# Patient Record
Sex: Male | Born: 1965 | Race: White | Hispanic: No | Marital: Married | Smoking: Never smoker
Health system: Southern US, Community
[De-identification: ages and names within clinical notes are randomized; demographics above are authoritative.]

## PROBLEM LIST (undated history)

## (undated) DIAGNOSIS — I10 Essential (primary) hypertension: Secondary | ICD-10-CM

## (undated) DIAGNOSIS — F419 Anxiety disorder, unspecified: Secondary | ICD-10-CM

## (undated) DIAGNOSIS — E7800 Pure hypercholesterolemia, unspecified: Secondary | ICD-10-CM

## (undated) DIAGNOSIS — K859 Acute pancreatitis without necrosis or infection, unspecified: Secondary | ICD-10-CM

## (undated) HISTORY — PX: VASECTOMY: SHX75

## (undated) HISTORY — PX: WRIST SURGERY: SHX841

---

## 2001-10-28 ENCOUNTER — Emergency Department (HOSPITAL_COMMUNITY): Admission: EM | Admit: 2001-10-28 | Discharge: 2001-10-28 | Payer: Self-pay | Admitting: Emergency Medicine

## 2001-10-28 ENCOUNTER — Encounter: Payer: Self-pay | Admitting: Emergency Medicine

## 2001-10-30 ENCOUNTER — Emergency Department (HOSPITAL_COMMUNITY): Admission: EM | Admit: 2001-10-30 | Discharge: 2001-10-30 | Payer: Self-pay | Admitting: Emergency Medicine

## 2002-01-04 ENCOUNTER — Ambulatory Visit (HOSPITAL_COMMUNITY): Admission: RE | Admit: 2002-01-04 | Discharge: 2002-01-04 | Payer: Self-pay | Admitting: Pulmonary Disease

## 2004-10-22 ENCOUNTER — Emergency Department (HOSPITAL_COMMUNITY): Admission: EM | Admit: 2004-10-22 | Discharge: 2004-10-23 | Payer: Self-pay | Admitting: *Deleted

## 2016-01-01 ENCOUNTER — Inpatient Hospital Stay (HOSPITAL_COMMUNITY)
Admission: EM | Admit: 2016-01-01 | Discharge: 2016-01-04 | DRG: 440 | Disposition: A | Discharge status: Home / Self Care | Payer: BC Managed Care – PPO | Attending: Internal Medicine | Admitting: Internal Medicine

## 2016-01-01 ENCOUNTER — Encounter (HOSPITAL_COMMUNITY): Payer: Self-pay | Admitting: *Deleted

## 2016-01-01 DIAGNOSIS — K219 Gastro-esophageal reflux disease without esophagitis: Secondary | ICD-10-CM | POA: Diagnosis present

## 2016-01-01 DIAGNOSIS — K852 Alcohol induced acute pancreatitis without necrosis or infection: Secondary | ICD-10-CM | POA: Diagnosis not present

## 2016-01-01 DIAGNOSIS — Z6836 Body mass index (BMI) 36.0-36.9, adult: Secondary | ICD-10-CM

## 2016-01-01 DIAGNOSIS — E785 Hyperlipidemia, unspecified: Secondary | ICD-10-CM | POA: Diagnosis present

## 2016-01-01 DIAGNOSIS — K760 Fatty (change of) liver, not elsewhere classified: Secondary | ICD-10-CM | POA: Diagnosis present

## 2016-01-01 DIAGNOSIS — F1010 Alcohol abuse, uncomplicated: Secondary | ICD-10-CM | POA: Diagnosis present

## 2016-01-01 DIAGNOSIS — K859 Acute pancreatitis without necrosis or infection, unspecified: Secondary | ICD-10-CM

## 2016-01-01 DIAGNOSIS — E669 Obesity, unspecified: Secondary | ICD-10-CM | POA: Diagnosis present

## 2016-01-01 DIAGNOSIS — I10 Essential (primary) hypertension: Secondary | ICD-10-CM | POA: Diagnosis present

## 2016-01-01 DIAGNOSIS — F329 Major depressive disorder, single episode, unspecified: Secondary | ICD-10-CM | POA: Diagnosis present

## 2016-01-01 DIAGNOSIS — G4733 Obstructive sleep apnea (adult) (pediatric): Secondary | ICD-10-CM | POA: Diagnosis present

## 2016-01-01 DIAGNOSIS — F419 Anxiety disorder, unspecified: Secondary | ICD-10-CM | POA: Diagnosis present

## 2016-01-01 HISTORY — DX: Anxiety disorder, unspecified: F41.9

## 2016-01-01 HISTORY — DX: Essential (primary) hypertension: I10

## 2016-01-01 HISTORY — DX: Pure hypercholesterolemia, unspecified: E78.00

## 2016-01-01 HISTORY — DX: Acute pancreatitis without necrosis or infection, unspecified: K85.90

## 2016-01-01 LAB — COMPREHENSIVE METABOLIC PANEL
ALT: 43 U/L (ref 17–63)
AST: 53 U/L — ABNORMAL HIGH (ref 15–41)
Albumin: 4.6 g/dL (ref 3.5–5.0)
Alkaline Phosphatase: 55 U/L (ref 38–126)
Anion gap: 11 (ref 5–15)
BUN: 20 mg/dL (ref 6–20)
CO2: 24 mmol/L (ref 22–32)
Calcium: 9.1 mg/dL (ref 8.9–10.3)
Chloride: 104 mmol/L (ref 101–111)
Creatinine, Ser: 1.05 mg/dL (ref 0.61–1.24)
GFR calc Af Amer: >60 mL/min (ref >60)
GFR calc non Af Amer: >60 mL/min (ref >60)
Glucose, Bld: 105 mg/dL — ABNORMAL HIGH (ref 65–99)
Potassium: 4.9 mmol/L (ref 3.5–5.1)
Sodium: 139 mmol/L (ref 135–145)
Total Bilirubin: 1 mg/dL (ref 0.3–1.2)
Total Protein: 6.7 g/dL (ref 6.5–8.1)

## 2016-01-01 LAB — CBC
HEMATOCRIT: 42.1 % (ref 39.0–52.0)
HEMOGLOBIN: 15 g/dL (ref 13.0–17.0)
MCH: 30.6 pg (ref 26.0–34.0)
MCHC: 35.6 g/dL (ref 30.0–36.0)
MCV: 85.9 fL (ref 78.0–100.0)
Platelets: 216 10*3/uL (ref 150–400)
RBC: 4.9 MIL/uL (ref 4.22–5.81)
RDW: 12.9 % (ref 11.5–15.5)
WBC: 9 10*3/uL (ref 4.0–10.5)

## 2016-01-01 LAB — LIPASE, BLOOD: Lipase: 185 U/L — ABNORMAL HIGH (ref 11–51)

## 2016-01-01 MED ORDER — ONDANSETRON HCL 4 MG/2ML IJ SOLN
4.0000 mg | Freq: Once | INTRAMUSCULAR | Status: AC
  Administered 2016-01-01: 4 mg via INTRAVENOUS
  Filled 2016-01-01: qty 2

## 2016-01-01 MED ORDER — MORPHINE SULFATE (PF) 4 MG/ML IV SOLN
6.0000 mg | Freq: Once | INTRAVENOUS | Status: AC
  Administered 2016-01-01: 6 mg via INTRAVENOUS
  Filled 2016-01-01: qty 2

## 2016-01-01 NOTE — ED Notes (Signed)
Pt c/o abd cramping since 1200, an hour after eating a big mac and fries. States he feels like he has a lot of gas but cannot find relief. 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 No N/V/D.

## 2016-01-01 NOTE — ED Provider Notes (Signed)
CSN: [id number]     Arrival date & time 01/01/16  1849 History   First MD Initiated Contact with Patient 01/01/16 2140     Chief Complaint  Patient presents with  . Abdominal Pain     (Consider location/radiation/quality/duration/timing/severity/associated sxs/prior Treatment) HPI   50 year old male with history of hypercholesterolemia and hypertension who presents for evaluation of abdominal pain. Patient report for lunch he went to [employer] and after eating a big Mac and fries he developed gradual onset of upper abdominal pain. He described pain as a sharp and crampy sensation, constant, worsening with movement and with palpation. He felt bloated, and despite taking Pepto-Bismol and Alka-Seltzer he found no relief. He has several normal bowel movements a day without any blood or mucus. He denies feeling nauseous or vomiting. No fever or chills, no chest pain or shortness of breath, no cough. He does admits to drinking alcohol on a daily basis and quantify as 6-9 beers nightly. He denies any prior history of pancreatitis or gallbladder disease. No prior history of abdominal surgery. No history of diabetes. Denies any recent injury.  Past Medical History  Diagnosis Date  . Hypertension   . Hypercholesteremia    History reviewed. No pertinent past surgical history. No family history on file. Social History  Substance Use Topics  . Smoking status: Never Smoker   . Smokeless tobacco: None  . Alcohol Use: Yes     Comment: beer and vodka daily    Review of Systems  All other systems reviewed and are negative.     Allergies  Review of patient's allergies indicates no known allergies.  Home Medications   Prior to Admission medications   Not on File   BP 147/93 mmHg  Pulse 79  Temp(Src) 98.1 F (36.7 C) (Oral)  Ht 5\' 9"  (1.753 m)  Wt 113.399 kg  BMI 36.90 kg/m2  SpO2 95% Physical Exam  Constitutional: He appears well-developed and well-nourished. No distress.  HENT:   Head: Atraumatic.  Eyes: Conjunctivae are normal.  Neck: Neck supple.  Cardiovascular: Normal rate and regular rhythm.   Pulmonary/Chest: Effort normal and breath sounds normal.  Abdominal: Bowel sounds are normal. He exhibits distension. There is tenderness (Tenderness to right upper quadrant on palpation without guarding or rebound tenderness abdomen is distended.).  Neurological: He is alert.  Skin: No rash noted.  Psychiatric: He has a normal mood and affect.  Nursing note and vitals reviewed.   ED Course  Procedures (including critical care time) Labs Review Labs Reviewed  LIPASE, BLOOD - Abnormal; Notable for the following:    Lipase 185 (*)    All other components within normal limits  COMPREHENSIVE METABOLIC PANEL - Abnormal; Notable for the following:    Glucose, Bld 105 (*)    AST 53 (*)    All other components within normal limits  URINALYSIS, ROUTINE W REFLEX MICROSCOPIC (NOT AT [facility]) - Abnormal; Notable for the following:    Specific Gravity, Urine >1.046 (*)    All other components within normal limits  CBC  I-STAT TROPOININ, ED    Imaging Review Ct Abdomen Pelvis W Contrast  01/02/2016  CLINICAL DATA:  Acute onset of generalized abdominal pain. Initial encounter. EXAM: CT ABDOMEN AND PELVIS WITH CONTRAST TECHNIQUE: Multidetector CT imaging of the abdomen and pelvis was performed using the standard protocol following bolus administration of intravenous contrast. CONTRAST:  OMNIPAQUE IOHEXOL 300 MG/ML  SOLN COMPARISON:  None. FINDINGS: Mild bibasilar atelectasis is noted. Soft tissue edema and  free fluid are noted tracking about the second segment of the duodenum, and about the adjacent pancreatic head, with fluid tracking inferiorly anterior to Gerota's fascia on the right. This may reflect inflammation due to duodenitis and underlying duodenal ulceration, or possibly pancreatitis. Would correlate with the patient's symptoms. The liver and spleen are unremarkable  in appearance. The gallbladder is within normal limits. The remainder of the pancreas and adrenal glands are unremarkable. A few small renal cysts are noted bilaterally, measuring up to 1.7 cm in size. There is no evidence of hydronephrosis. A 3 mm stone is noted near the lower pole of the right kidney. No obstructing ureteral stones are seen. Mild nonspecific perinephric stranding is noted bilaterally. No free fluid is identified. The small bowel is unremarkable in appearance. The stomach is within normal limits. No acute vascular abnormalities are seen. The appendix is normal in caliber, without evidence of appendicitis. The appendix tracks about the inferior tip of the liver. The colon is largely decompressed and is unremarkable in appearance. The bladder is mildly distended and grossly unremarkable. The prostate remains normal in size, with minimal calcification. No inguinal lymphadenopathy is seen. No acute osseous abnormalities are identified. Facet disease is noted along the lumbar spine, with a chronic defect across a left-sided articular facet at L3. IMPRESSION: 1. Soft tissue edema and free fluid tracking about the second segment of the duodenum and adjacent pancreatic head, with fluid tracking inferiorly anterior to Gerota's fascia on the right. This may reflect inflammation due to duodenitis and underlying duodenal ulceration, or possibly acute pancreatitis. Would correlate with the patient's symptoms. 2. Few small bilateral renal cysts noted. 3 mm nonobstructing stone near the lower pole of the right kidney. 3. Mild bibasilar atelectasis noted. Electronically Signed   By: Roanna RaiderJeffery  Chang M.D.   On: 01/02/2016 01:07   I have personally reviewed and evaluated these images and lab results as part of my medical decision-making.   EKG Interpretation None      Date: 01/02/2016  Rate: 62  Rhythm: normal sinus rhythm  QRS Axis: normal  Intervals: normal  ST/T Wave abnormalities: normal  Conduction  Disutrbances: none  Narrative Interpretation:   Old EKG Reviewed: No significant changes noted EKG reviewed by me.       MDM   Final diagnoses:  Alcohol-induced acute pancreatitis, unspecified complication status    BP 128/78 mmHg  Pulse 68  Temp(Src) 98.1 F (36.7 C) (Oral)  Resp 15  Ht 5\' 9"  (1.753 m)  Wt 113.399 kg  BMI 36.90 kg/m2  SpO2 95%   Patient with history of alcohol abuse who presents with complaints of upper abdominal pain after [facility]asy [facility]. Pain is right upper quadrant on palpation. He does have elevated lipase 185, normal hepatic function panel and no leukocytosis. Symptoms concerning for pancreatitis versus cholecystitis. Plan to obtain abdominal and pelvis CT scan for further evaluation. Pain medication given, will monitor closely.  Care discussed with Dr. Hyacinth MeekerMiller. Pt's CAGE score is 1.    12:41 AM  Abdominal and pelvis CT scan demonstrated evidence of soft tissues edema and free fluid tracking about the second segment of the duodenum and the adjacent pancreatic head. This may suggest inflammations due to duodenitis versus acute pancreatitis. Since patient admits to drinking alcohol regularly basis and having his present symptoms, and also having an elevated lipase I suspect this is acute pancreatitis. His symptoms has improved after receiving treatment. Plan to consult medicine for admission for further management  of his condition. Pt's Initial Ranson Score is zero.    1:33 AM Appreciate consultation from [facility] Dr. Maryfrances Bunnell who agrees to see pt in the ER and will admit to med surg bed under his care.  Pt agrees with plan.    Fayrene Helper, PA-C 01/02/16 0134  Eber Hong, MD 01/02/16 [phone]

## 2016-01-02 ENCOUNTER — Encounter (HOSPITAL_COMMUNITY): Payer: Self-pay | Admitting: Family Medicine

## 2016-01-02 ENCOUNTER — Observation Stay (HOSPITAL_COMMUNITY): Payer: BC Managed Care – PPO

## 2016-01-02 ENCOUNTER — Emergency Department (HOSPITAL_COMMUNITY): Payer: BC Managed Care – PPO

## 2016-01-02 DIAGNOSIS — K219 Gastro-esophageal reflux disease without esophagitis: Secondary | ICD-10-CM | POA: Diagnosis present

## 2016-01-02 DIAGNOSIS — F419 Anxiety disorder, unspecified: Secondary | ICD-10-CM | POA: Diagnosis present

## 2016-01-02 DIAGNOSIS — I10 Essential (primary) hypertension: Secondary | ICD-10-CM | POA: Diagnosis present

## 2016-01-02 DIAGNOSIS — K859 Acute pancreatitis without necrosis or infection, unspecified: Secondary | ICD-10-CM | POA: Diagnosis not present

## 2016-01-02 DIAGNOSIS — F329 Major depressive disorder, single episode, unspecified: Secondary | ICD-10-CM | POA: Diagnosis present

## 2016-01-02 DIAGNOSIS — K852 Alcohol induced acute pancreatitis without necrosis or infection: Secondary | ICD-10-CM | POA: Diagnosis present

## 2016-01-02 DIAGNOSIS — E785 Hyperlipidemia, unspecified: Secondary | ICD-10-CM | POA: Diagnosis present

## 2016-01-02 DIAGNOSIS — G4733 Obstructive sleep apnea (adult) (pediatric): Secondary | ICD-10-CM | POA: Diagnosis not present

## 2016-01-02 DIAGNOSIS — K760 Fatty (change of) liver, not elsewhere classified: Secondary | ICD-10-CM | POA: Diagnosis present

## 2016-01-02 DIAGNOSIS — E669 Obesity, unspecified: Secondary | ICD-10-CM | POA: Diagnosis present

## 2016-01-02 DIAGNOSIS — F1010 Alcohol abuse, uncomplicated: Secondary | ICD-10-CM | POA: Diagnosis present

## 2016-01-02 DIAGNOSIS — Z6836 Body mass index (BMI) 36.0-36.9, adult: Secondary | ICD-10-CM | POA: Diagnosis not present

## 2016-01-02 HISTORY — DX: Acute pancreatitis without necrosis or infection, unspecified: K85.90

## 2016-01-02 LAB — COMPREHENSIVE METABOLIC PANEL
ALBUMIN: 3.8 g/dL (ref 3.5–5.0)
ALK PHOS: 51 U/L (ref 38–126)
ALT: 40 U/L (ref 17–63)
ANION GAP: 9 (ref 5–15)
AST: 30 U/L (ref 15–41)
BILIRUBIN TOTAL: 0.9 mg/dL (ref 0.3–1.2)
BUN: 16 mg/dL (ref 6–20)
CALCIUM: 8.8 mg/dL — AB (ref 8.9–10.3)
CO2: 26 mmol/L (ref 22–32)
Chloride: 105 mmol/L (ref 101–111)
Creatinine, Ser: 0.9 mg/dL (ref 0.61–1.24)
GFR calc Af Amer: >60 mL/min (ref >60)
Glucose, Bld: 114 mg/dL — ABNORMAL HIGH (ref 65–99)
Potassium: 3.9 mmol/L (ref 3.5–5.1)
Sodium: 140 mmol/L (ref 135–145)
TOTAL PROTEIN: 5.8 g/dL — AB (ref 6.5–8.1)

## 2016-01-02 LAB — CBC
HEMATOCRIT: 39.7 % (ref 39.0–52.0)
HEMOGLOBIN: 13.9 g/dL (ref 13.0–17.0)
MCH: 30.2 pg (ref 26.0–34.0)
MCHC: 35 g/dL (ref 30.0–36.0)
MCV: 86.3 fL (ref 78.0–100.0)
Platelets: 193 10*3/uL (ref 150–400)
RBC: 4.6 MIL/uL (ref 4.22–5.81)
RDW: 13 % (ref 11.5–15.5)
WBC: 7.3 10*3/uL (ref 4.0–10.5)

## 2016-01-02 LAB — URINALYSIS, ROUTINE W REFLEX MICROSCOPIC
BILIRUBIN URINE: NEGATIVE
Glucose, UA: NEGATIVE mg/dL
HGB URINE DIPSTICK: NEGATIVE
KETONES UR: NEGATIVE mg/dL
Leukocytes, UA: NEGATIVE
NITRITE: NEGATIVE
PH: 6.5 (ref 5.0–8.0)
Protein, ur: NEGATIVE mg/dL

## 2016-01-02 LAB — I-STAT TROPONIN, ED: Troponin i, poc: 0 ng/mL (ref 0.00–0.08)

## 2016-01-02 MED ORDER — ENOXAPARIN SODIUM 60 MG/0.6ML ~~LOC~~ SOLN
55.0000 mg | SUBCUTANEOUS | Status: DC
  Administered 2016-01-02 – 2016-01-04 (×3): 55 mg via SUBCUTANEOUS
  Filled 2016-01-02 (×3): qty 0.6

## 2016-01-02 MED ORDER — ATORVASTATIN CALCIUM 80 MG PO TABS
80.0000 mg | ORAL_TABLET | Freq: Every day | ORAL | Status: DC
  Administered 2016-01-02 – 2016-01-03 (×2): 80 mg via ORAL
  Filled 2016-01-02 (×2): qty 1

## 2016-01-02 MED ORDER — IOHEXOL 300 MG/ML  SOLN
100.0000 mL | Freq: Once | INTRAMUSCULAR | Status: AC | PRN
  Administered 2016-01-02: 100 mL via INTRAVENOUS

## 2016-01-02 MED ORDER — SODIUM CHLORIDE 0.9 % IV SOLN
INTRAVENOUS | Status: AC
  Administered 2016-01-02 – 2016-01-03 (×3): via INTRAVENOUS

## 2016-01-02 MED ORDER — SODIUM CHLORIDE 0.9 % IV BOLUS (SEPSIS)
1000.0000 mL | Freq: Once | INTRAVENOUS | Status: AC
Start: 2016-01-02 — End: 2016-01-02
  Administered 2016-01-02: 1000 mL via INTRAVENOUS

## 2016-01-02 MED ORDER — PANTOPRAZOLE SODIUM 40 MG PO TBEC
40.0000 mg | DELAYED_RELEASE_TABLET | Freq: Every day | ORAL | Status: DC
  Administered 2016-01-02: 40 mg via ORAL
  Filled 2016-01-02: qty 1

## 2016-01-02 MED ORDER — POLYETHYLENE GLYCOL 3350 17 G PO PACK: 17.0000 g | PACK | Freq: Every day | ORAL | Status: DC | PRN

## 2016-01-02 MED ORDER — CITALOPRAM HYDROBROMIDE 40 MG PO TABS
40.0000 mg | ORAL_TABLET | Freq: Every day | ORAL | Status: DC
  Administered 2016-01-02 – 2016-01-04 (×3): 40 mg via ORAL
  Filled 2016-01-02 (×3): qty 1

## 2016-01-02 MED ORDER — MORPHINE SULFATE (PF) 4 MG/ML IV SOLN
6.0000 mg | Freq: Once | INTRAVENOUS | Status: AC
  Administered 2016-01-02: 6 mg via INTRAVENOUS
  Filled 2016-01-02: qty 2

## 2016-01-02 MED ORDER — DOCUSATE SODIUM 100 MG PO CAPS
100.0000 mg | ORAL_CAPSULE | Freq: Two times a day (BID) | ORAL | Status: DC
  Administered 2016-01-02 – 2016-01-04 (×5): 100 mg via ORAL
  Filled 2016-01-02 (×5): qty 1

## 2016-01-02 MED ORDER — ONDANSETRON HCL 4 MG PO TABS
4.0000 mg | ORAL_TABLET | Freq: Four times a day (QID) | ORAL | Status: DC | PRN
Start: 2016-01-02 — End: 2016-01-04

## 2016-01-02 MED ORDER — ONDANSETRON HCL 4 MG/2ML IJ SOLN: 4.0000 mg | Freq: Four times a day (QID) | INTRAMUSCULAR | Status: DC | PRN

## 2016-01-02 MED ORDER — AMLODIPINE BESYLATE 2.5 MG PO TABS
2.5000 mg | ORAL_TABLET | Freq: Every day | ORAL | Status: DC
  Administered 2016-01-02 – 2016-01-04 (×3): 2.5 mg via ORAL
  Filled 2016-01-02 (×3): qty 1

## 2016-01-02 MED ORDER — PANTOPRAZOLE SODIUM 40 MG IV SOLR
40.0000 mg | Freq: Two times a day (BID) | INTRAVENOUS | Status: DC
  Administered 2016-01-02 – 2016-01-03 (×3): 40 mg via INTRAVENOUS
  Filled 2016-01-02 (×3): qty 40

## 2016-01-02 MED ORDER — ASPIRIN EC 81 MG PO TBEC
81.0000 mg | DELAYED_RELEASE_TABLET | Freq: Every day | ORAL | Status: DC
  Administered 2016-01-02 – 2016-01-04 (×3): 81 mg via ORAL
  Filled 2016-01-02 (×3): qty 1

## 2016-01-02 MED ORDER — HYDROMORPHONE HCL 1 MG/ML IJ SOLN
0.5000 mg | INTRAMUSCULAR | Status: DC | PRN
  Administered 2016-01-02 (×4): 1 mg via INTRAVENOUS
  Filled 2016-01-02 (×4): qty 1

## 2016-01-02 MED ORDER — LABETALOL HCL 5 MG/ML IV SOLN
5.0000 mg | Freq: Four times a day (QID) | INTRAVENOUS | Status: DC | PRN
  Filled 2016-01-02: qty 4

## 2016-01-02 NOTE — H&P (Signed)
History and Physical  Patient Name: Raymond Henry     ZOX:[id number]    DOB: 08/28/1966    DOA: 01/01/2016 Referring physician: Fayrene Helper, PA-C PCP: No PCP Per Patient previously Geni Bers     Chief Complaint: Abdominal pain  HPI: Raymond Henry is a 50 y.o. male with a past medical history significant for HTN, OSA, and GERD who presents with acute epigastric pain.  The patient was in his usual state of health until today at lunch about 30 minutes after eating he developed moderate to severe epigastric pain and bloating after eating a hamburger at a fast food restaurant.  He and his wife went to the store after, and the pain was not exertional, but it was so bad he had to go home and lie down.  It was not associated with emesis, diarrhea, hematemesis, or exertional symptoms. However the pain did not improve with antacids and rest and so he came to the ER.  In the ED, the patient was afebrile and hemodynamically stable. He had normal renal function, no leukocytosis, and a normal troponin and ECG. The AST was slightly above the upper limit of normal, the lipase was 185 U/L, and a CT of the abdomen and pelvis with contrast showed early pancreatitis. Pain medicine and IV fluids were administered, and TSH were asked to admit for pancreatitis.  The patient is taking no new medicines or supplements. He has never had pancreatitis before or gallstones. He drinks alcohol most days, beer and mixed drinks, and quantifies this as anywhere from 3 to "a few", although he estimates that he has been drinking less over the last 2 weeks than usual.     Review of Systems:  Pt complains of epigastric pain, right upper quadrant pain. Pt denies any dyspnea, chest pain, diaphoresis.   denies emesis, hematemesis, diarrhea, melena. All other systems negative except as just noted or noted in the history of present illness.   Allergies: No Known Allergies  Home medications: 1. Amlodipine 2.5 mg  daily 2. Losartan-hydrochlorothiazide 100-25 milligrams daily 3. Omeprazole 20 mg daily 4. Aspirin 81 mg daily 5. Atorvastatin 80 mg daily 6. Citalopram 40 mg daily Albuterol as needed  Past medical history: 1. Essential hypertension 2. Obesity 3. Allergic rhinitis and chronic bronchitis 4. OSA on CPAP 5. Erectile dysfunction, previously on testosterone supplements  Past surgical history: 1. Wrist surgery 2. Vasectomy  Family history:  Father, diabetes, amputations. Mother, strokes in her 50s, deceased at age 70.    Social History:  Patient lives with his wife in [place]. He works at [employer].  He is a nonsmoker. He reports daily alcohol use.       Physical Exam: BP 144/82 mmHg  Pulse 64  Temp(Src) 98.1 F (36.7 C) (Oral)  Resp 19  Ht 5\' 9"  (1.753 m)  Wt 113.399 kg (250 lb)  BMI 36.90 kg/m2  SpO2 95% General appearance: Well-developed, obese adult male, alert and in no acute distress.   Eyes: Anicteric, conjunctiva pink, lids and lashes normal.     ENT: No nasal deformity, discharge, or epistaxis.  OP moist without lesions.   Lymph: No cervical, supraclavicular lymphadenopathy. Skin: Warm and dry.  No jaundice.  No suspicious rashes or lesions. Cardiac: RRR, nl S1-S2, no murmurs appreciated.  Capillary refill is brisk.  No LE edema.  Radial and DP pulses 2+ and symmetric. Respiratory: Normal respiratory rate and rhythm.  CTAB without rales or wheezes. Abdomen: Abdomen soft without rigidity.  Mild RUQ and  epigastric TTP. No ascites, distension.   MSK: No deformities or effusions. Neuro: Sensorium intact and responding to questions, attention normal.  Speech is fluent.  Moves all extremities equally and with normal coordination.    Psych: Behavior appropriate.  Affect normal.  No evidence of aural or visual hallucinations or delusions.       Labs on Admission:  The metabolic panel shows normal sodium, potassium, bicarbonate, and renal function. The AST is slightly  above the upper limit of normal, the ALT is normal (as it was in September per care everywhere ), the alkaline phosphatase and bilirubin are normal. The troponin is negative. The urinalysis shows elevated specific gravity and is otherwise bland. The lipase is 185 U/L. The complete blood count shows no anemia, leukocytosis, or thrombocytopenia.   Radiological Exams on Admission: Ct Abdomen Pelvis W Contrast 01/02/2016  IMPRESSION: 1. Soft tissue edema and free fluid tracking about the second segment of the duodenum and adjacent pancreatic head, with fluid tracking inferiorly anterior to Gerota's fascia on the right. This may reflect inflammation due to duodenitis and underlying duodenal ulceration, or possibly acute pancreatitis. Would correlate with the patient's symptoms. 2. Few small bilateral renal cysts noted. 3 mm nonobstructing stone near the lower pole of the right kidney. 3. Mild bibasilar atelectasis noted. Electronically Signed   By: Roanna RaiderJeffery  Chang M.D.   On: 01/02/2016 01:07    EKG: Independently reviewed. Sinus rhythm, rate 62, QTC 413, no ST TW changes.     Assessment/Plan 1. Acute pancreatitis:  This is new.  Suspected etiology is alcohol.  Gallstone pancreatitis is within the differential given elevated AST.    -NPO -IVF and hydromorphone 0.5-1 mg q2hrs PRN for pain -US abdomen complete  2. Elevated AST:  This is new.  Previous records show an ALT that was normal on routine outpatient lab work. This is either related to nonalcoholic fatty liver disease or possibly a gallstone. -US abdomen complete -Trend LFTs -If dilated CBD or LFTs trending up, will consult GI -If not, this AST can be followed up as outpatient  3. Essential hypertension:  Slightly hypertensive at admission. -Continue amlodipine -Hold losartan and diuretic until pancreatitis clearly resolving -Labetalol PRN for very high BP  4. History of anxiety/depression:  -Continue home citalopram  5.  OSA: -Nightly CPAP    DVT PPx: Lovenox Diet: NPO Consultants: None Code Status: Full Family Communication: Wife, present at bedside  Medical decision making: What exists of the patient's previous chart and Care Everywhere was reviewed in depth and the case was discussed with Fayrene HelperBowie Tran, PA-C. Patient seen 2:02 AM on 01/02/2016.  Disposition Plan:  Observe and treat with IVF and IV analgesics.  Patient already hungry, and may be able to advance diet quickly and be motivated to discharge.  At the point of initial evaluation, it is my clinical opinion that admission for OBSERVATION is reasonable and necessary because the patient's presenting complaints in the context of their chronic conditions represent sufficient risk of deterioration or significant morbidity to constitute reasonable grounds for close observation in the hospital setting, but that the patient may be medically stable for discharge from the hospital within 24 to 48 hours.       Alberteen SamChristopher P Meg Niemeier [facility] Pager [phone]

## 2016-01-02 NOTE — Progress Notes (Signed)
PATIENT DETAILS Name: Raymond Henry: 50 y.o. Sex: male Date of Birth: 09/13/1966 Admit Date: 01/01/2016 Admitting Physician Alberteen Sam, MD PCP:No PCP Per Patient  Subjective: Abdominal pain slowly improving-related it around 3-4/10 this morning.  Assessment/Plan: Principal Problem: Acute pancreatitis: Suspect secondary to alcohol-patient does acknowledge drinking 2-516 ounce bottles of beer almost on a daily basis. No gallstones evident on CT scan or abdominal ultrasound. Abdomen is soft with mild tenderness in the epigastric area. Continue nothing by mouth status for today, will reassess tomorrow and start clear liquids. Have encouraged patient completely abstain from alcohol use in the future.  Active Problems: Alcohol abuse: No signs of withdrawal, per patient he can go days without having any tremors. Follow and start Ativan per protocol if needed.  GERD: Continue PPI-change to twice a day for now  Dyslipidemia: Continue statin  Hypertension: Controlled, continue amlodipine  OSA (obstructive sleep apnea): Continue CPAP  History of anxiety/depression: Continue Celexa  Disposition: Remain inpatient  Antimicrobial agents  See below  Anti-infectives    None      DVT Prophylaxis: Prophylactic Lovenox   Code Status: Full code   Family Communication None at bedside  Procedures: None  CONSULTS:  None  Time spent 40 minutes-Greater than 50% of this time was spent in counseling, explanation of diagnosis, planning of further management, and coordination of care.  MEDICATIONS: Scheduled Meds: . amLODipine  2.5 mg Oral Daily  . aspirin EC  81 mg Oral Daily  . atorvastatin  80 mg Oral q1800  . citalopram  40 mg Oral Daily  . docusate sodium  100 mg Oral BID  . enoxaparin (LOVENOX) injection  55 mg Subcutaneous Q24H  . pantoprazole  40 mg Oral Daily   Continuous Infusions: . sodium chloride 150 mL/hr at 01/02/16 1137   PRN  Meds:.HYDROmorphone (DILAUDID) injection, labetalol, ondansetron **OR** ondansetron (ZOFRAN) IV, polyethylene glycol    PHYSICAL EXAM: Vital signs in last 24 hours: Filed Vitals:   01/02/16 0200 01/02/16 0248 01/02/16 1133 01/02/16 1256  BP: 136/82 121/76 134/76 129/83  Pulse: 65 68  73  Temp:  98.1 F (36.7 C) 98 F (36.7 C) 98.1 F (36.7 C)  TempSrc:  Oral  Oral  Resp: 18 18  18   Height:      Weight:      SpO2: 95% 94%  94%    Weight change:  Filed Weights   01/01/16 1858  Weight: 113.399 kg (250 lb)   Body mass index is 36.9 kg/(m^2).   Gen Exam: Awake and alert with clear speech.   Neck: Supple, No JVD.   Chest: B/L Clear.   CVS: S1 S2 Regular, no murmurs.  Abdomen: soft, BS +, mildly tender epigastric area, non distended.  Extremities: no edema, lower extremities warm to touch. Neurologic: Non Focal.   Skin: No Rash.   Wounds: N/A.    Intake/Output from previous day:  Intake/Output Summary (Last 24 hours) at 01/02/16 1514 Last data filed at 01/02/16 1258  Gross per 24 hour  Intake      0 ml  Output      0 ml  Net      0 ml     LAB RESULTS: CBC  Recent Labs Lab 01/01/16 1907 01/02/16 0321  WBC 9.0 7.3  HGB 15.0 13.9  HCT 42.1 39.7  PLT 216 193  MCV 85.9 86.3  MCH 30.6 30.2  MCHC  35.6 35.0  RDW 12.9 13.0    Chemistries   Recent Labs Lab 01/01/16 1907 01/02/16 0321  NA 139 140  K 4.9 3.9  CL 104 105  CO2 24 26  GLUCOSE 105* 114*  BUN 20 16  CREATININE 1.05 0.90  CALCIUM 9.1 8.8*    CBG: No results for input(s): GLUCAP in the last 168 hours.  GFR Estimated Creatinine Clearance: 120.6 mL/min (by C-G formula based on Cr of 0.9).  Coagulation profile No results for input(s): INR, PROTIME in the last 168 hours.  Cardiac Enzymes No results for input(s): CKMB, TROPONINI, MYOGLOBIN in the last 168 hours.  Invalid input(s): CK  Invalid input(s): POCBNP No results for input(s): DDIMER in the last 72 hours. No results for  input(s): HGBA1C in the last 72 hours. No results for input(s): CHOL, HDL, LDLCALC, TRIG, CHOLHDL, LDLDIRECT in the last 72 hours. No results for input(s): TSH, T4TOTAL, T3FREE, THYROIDAB in the last 72 hours.  Invalid input(s): FREET3 No results for input(s): VITAMINB12, FOLATE, FERRITIN, TIBC, IRON, RETICCTPCT in the last 72 hours.  Recent Labs  01/01/16 1907  LIPASE 185*    Urine Studies No results for input(s): UHGB, CRYS in the last 72 hours.  Invalid input(s): UACOL, UAPR, USPG, UPH, UTP, UGL, UKET, UBIL, UNIT, UROB, ULEU, UEPI, UWBC, URBC, UBAC, CAST, UCOM, BILUA  MICROBIOLOGY: No results found for this or any previous visit (from the past 240 hour(s)).  RADIOLOGY STUDIES/RESULTS: US Abdomen Complete  01/02/2016  CLINICAL DATA:  Pancreatitis EXAM: ABDOMEN ULTRASOUND COMPLETE COMPARISON:  Abdominal CT from the same day FINDINGS: Gallbladder: No gallstones or wall thickening visualized. No sonographic Murphy sign noted by sonographer. Common bile duct: Diameter: 6 mm. The duct is partially visualized due to narrow sonographic windows. Where visualized, no filling defect. Liver: Echogenic and heterogeneous with sparing noted around the gallbladder fossa. No evidence of focal lesion. Antegrade flow in the imaged portal venous system. IVC: Negative by grayscale. Pancreas: Expanded appearance of the head correlating with history of pancreatitis. Spleen: Size and appearance within normal limits. Right Kidney: Length: 14 cm. 13 mm cyst noted. Echogenicity within normal limits. No solid mass or hydronephrosis visualized. Left Kidney: Length: 13 cm. The cortex appears thin, but no thinning noted on previous abdominal CT. Echogenicity within normal limits. No mass or hydronephrosis visualized. Abdominal aorta: No aneurysm visualized. Other findings: None. IMPRESSION: 1. No evidence of biliary calculus. 2. Hepatic steatosis. Electronically Signed   By: Marnee Spring M.D.   On: 01/02/2016 07:59    Ct Abdomen Pelvis W Contrast  01/02/2016  CLINICAL DATA:  Acute onset of generalized abdominal pain. Initial encounter. EXAM: CT ABDOMEN AND PELVIS WITH CONTRAST TECHNIQUE: Multidetector CT imaging of the abdomen and pelvis was performed using the standard protocol following bolus administration of intravenous contrast. CONTRAST:  OMNIPAQUE IOHEXOL 300 MG/ML  SOLN COMPARISON:  None. FINDINGS: Mild bibasilar atelectasis is noted. Soft tissue edema and free fluid are noted tracking about the second segment of the duodenum, and about the adjacent pancreatic head, with fluid tracking inferiorly anterior to Gerota's fascia on the right. This may reflect inflammation due to duodenitis and underlying duodenal ulceration, or possibly pancreatitis. Would correlate with the patient's symptoms. The liver and spleen are unremarkable in appearance. The gallbladder is within normal limits. The remainder of the pancreas and adrenal glands are unremarkable. A few small renal cysts are noted bilaterally, measuring up to 1.7 cm in size. There is no evidence of hydronephrosis. A 3 mm  stone is noted near the lower pole of the right kidney. No obstructing ureteral stones are seen. Mild nonspecific perinephric stranding is noted bilaterally. No free fluid is identified. The small bowel is unremarkable in appearance. The stomach is within normal limits. No acute vascular abnormalities are seen. The appendix is normal in caliber, without evidence of appendicitis. The appendix tracks about the inferior tip of the liver. The colon is largely decompressed and is unremarkable in appearance. The bladder is mildly distended and grossly unremarkable. The prostate remains normal in size, with minimal calcification. No inguinal lymphadenopathy is seen. No acute osseous abnormalities are identified. Facet disease is noted along the lumbar spine, with a chronic defect across a left-sided articular facet at L3. IMPRESSION: 1. Soft tissue  edema and free fluid tracking about the second segment of the duodenum and adjacent pancreatic head, with fluid tracking inferiorly anterior to Gerota's fascia on the right. This may reflect inflammation due to duodenitis and underlying duodenal ulceration, or possibly acute pancreatitis. Would correlate with the patient's symptoms. 2. Few small bilateral renal cysts noted. 3 mm nonobstructing stone near the lower pole of the right kidney. 3. Mild bibasilar atelectasis noted. Electronically Signed   By: Roanna RaiderJeffery  Chang M.D.   On: 01/02/2016 01:07    Jeoffrey MassedGHIMIRE,SHANKER, MD  [facility] Pager:[phone]  If 7PM-7AM, please contact night-coverage [email or web address] Password [email or web address] 01/02/2016, 3:14 PM   LOS: 0 days

## 2016-01-02 NOTE — Progress Notes (Signed)
Patient did not feel that facility CPAP provided the pressure that he needed and brought his home unit.  Power cord inspected with no obvious tears/fraying noted.  Home unit started and applied by patient in front of RT without issue.

## 2016-01-03 DIAGNOSIS — G4733 Obstructive sleep apnea (adult) (pediatric): Secondary | ICD-10-CM

## 2016-01-03 DIAGNOSIS — I10 Essential (primary) hypertension: Secondary | ICD-10-CM

## 2016-01-03 LAB — COMPREHENSIVE METABOLIC PANEL
ALBUMIN: 3.5 g/dL (ref 3.5–5.0)
ALK PHOS: 46 U/L (ref 38–126)
ALT: 30 U/L (ref 17–63)
ANION GAP: 8 (ref 5–15)
AST: 22 U/L (ref 15–41)
BUN: 10 mg/dL (ref 6–20)
CALCIUM: 8.7 mg/dL — AB (ref 8.9–10.3)
CHLORIDE: 105 mmol/L (ref 101–111)
CO2: 27 mmol/L (ref 22–32)
Creatinine, Ser: 0.86 mg/dL (ref 0.61–1.24)
GFR calc non Af Amer: >60 mL/min (ref >60)
GLUCOSE: 106 mg/dL — AB (ref 65–99)
POTASSIUM: 3.8 mmol/L (ref 3.5–5.1)
SODIUM: 140 mmol/L (ref 135–145)
Total Bilirubin: 1.4 mg/dL — ABNORMAL HIGH (ref 0.3–1.2)
Total Protein: 5.7 g/dL — ABNORMAL LOW (ref 6.5–8.1)

## 2016-01-03 LAB — CBC
HEMATOCRIT: 38.3 % — AB (ref 39.0–52.0)
Hemoglobin: 13.1 g/dL (ref 13.0–17.0)
MCH: 29.6 pg (ref 26.0–34.0)
MCHC: 34.2 g/dL (ref 30.0–36.0)
MCV: 86.7 fL (ref 78.0–100.0)
Platelets: 167 10*3/uL (ref 150–400)
RBC: 4.42 MIL/uL (ref 4.22–5.81)
RDW: 12.7 % (ref 11.5–15.5)
WBC: 5.4 10*3/uL (ref 4.0–10.5)

## 2016-01-03 LAB — LIPASE, BLOOD: LIPASE: 45 U/L (ref 11–51)

## 2016-01-03 MED ORDER — PANTOPRAZOLE SODIUM 40 MG PO TBEC
40.0000 mg | DELAYED_RELEASE_TABLET | Freq: Two times a day (BID) | ORAL | Status: DC
  Administered 2016-01-03 – 2016-01-04 (×2): 40 mg via ORAL
  Filled 2016-01-03 (×2): qty 1

## 2016-01-03 MED ORDER — HYDROMORPHONE HCL 1 MG/ML IJ SOLN: 0.5000 mg | INTRAMUSCULAR | Status: DC | PRN

## 2016-01-03 MED ORDER — OXYCODONE HCL 5 MG PO TABS: 5.0000 mg | ORAL_TABLET | Freq: Four times a day (QID) | ORAL | Status: DC | PRN

## 2016-01-03 NOTE — Progress Notes (Signed)
PATIENT DETAILS Name: Raymond MatasDwight Henry Age: 50 y.o. Sex: male Date of Birth: Feb 09, 1966 Admit Date: 01/01/2016 Admitting Physician Alberteen Samhristopher P Danford, MD PCP:No PCP Per Patient  Subjective: No Abdominal pain this morning.   Assessment/Plan: Principal Problem: Acute pancreatitis: Suspect secondary to alcohol-patient does acknowledge drinking 2-516 ounce bottles of beer almost on a daily basis. No gallstones evident on CT scan or abdominal ultrasound. Abdomen is soft with mild tenderness in the epigastric area.  Initially kept nothing by mouth, since improved, will start clear liquids and advance diet. Have encouraged patient completely abstain from alcohol use in the future.  Active Problems: Alcohol abuse: No signs of withdrawal, per patient he can go days without having any tremors. Follow and start Ativan per protocol if needed.  GERD: Continue PPI-change to twice a day for now  Dyslipidemia: Continue statin  Hypertension: Controlled, continue amlodipine  OSA (obstructive sleep apnea): Continue CPAP  History of anxiety/depression: Continue Celexa  Disposition: Remain inpatient-home tomorrow if clinical improvement continues   Antimicrobial agents  See below  Anti-infectives    None      DVT Prophylaxis: Prophylactic Lovenox   Code Status: Full code   Family Communication Spouse at bedside  Procedures: None  CONSULTS:  None  Time spent 420 minutes-Greater than 50% of this time was spent in counseling, explanation of diagnosis, planning of further management, and coordination of care.  MEDICATIONS: Scheduled Meds: . amLODipine  2.5 mg Oral Daily  . aspirin EC  81 mg Oral Daily  . atorvastatin  80 mg Oral q1800  . citalopram  40 mg Oral Daily  . docusate sodium  100 mg Oral BID  . enoxaparin (LOVENOX) injection  55 mg Subcutaneous Q24H  . pantoprazole (PROTONIX) IV  40 mg Intravenous Q12H   Continuous Infusions: . sodium chloride  50 mL/hr at 01/03/16 0959   PRN Meds:.HYDROmorphone (DILAUDID) injection, labetalol, ondansetron **OR** ondansetron (ZOFRAN) IV, oxyCODONE, polyethylene glycol    PHYSICAL EXAM: Vital signs in last 24 hours: Filed Vitals:   01/02/16 1133 01/02/16 1256 01/02/16 2224 01/03/16 0700  BP: 134/76 129/83 148/73 138/67  Pulse:  73 79 66  Temp: 98 F (36.7 C) 98.1 F (36.7 C) 98.9 F (37.2 C) 98.3 F (36.8 C)  TempSrc:  Oral Oral Oral  Resp:  18 18 18   Height:      Weight:      SpO2:  94% 93% 96%    Weight change:  Filed Weights   01/01/16 1858  Weight: 113.399 kg (250 lb)   Body mass index is 36.9 kg/(m^2).   Gen Exam: Awake and alert with clear speech.   Neck: Supple, No JVD.   Chest: B/L Clear.   CVS: S1 S2 Regular, no murmurs.  Abdomen: soft, BS +, nontender , non distended.  Extremities: no edema, lower extremities warm to touch. Neurologic: Non Focal.   Skin: No Rash.   Wounds: N/A.    Intake/Output from previous day:  Intake/Output Summary (Last 24 hours) at 01/03/16 1228 Last data filed at 01/03/16 0600  Gross per 24 hour  Intake   1650 ml  Output      0 ml  Net   1650 ml     LAB RESULTS: CBC  Recent Labs Lab 01/01/16 1907 01/02/16 0321 01/03/16 0533  WBC 9.0 7.3 5.4  HGB 15.0 13.9 13.1  HCT 42.1 39.7 38.3*  PLT 216 193 167  MCV  85.9 86.3 86.7  MCH 30.6 30.2 29.6  MCHC 35.6 35.0 34.2  RDW 12.9 13.0 12.7    Chemistries   Recent Labs Lab 01/01/16 1907 01/02/16 0321 01/03/16 0533  NA 139 140 140  K 4.9 3.9 3.8  CL 104 105 105  CO2 24 26 27   GLUCOSE 105* 114* 106*  BUN 20 16 10   CREATININE 1.05 0.90 0.86  CALCIUM 9.1 8.8* 8.7*    CBG: No results for input(s): GLUCAP in the last 168 hours.  GFR Estimated Creatinine Clearance: 126.2 mL/min (by C-G formula based on Cr of 0.86).  Coagulation profile No results for input(s): INR, PROTIME in the last 168 hours.  Cardiac Enzymes No results for input(s): CKMB, TROPONINI, MYOGLOBIN in  the last 168 hours.  Invalid input(s): CK  Invalid input(s): POCBNP No results for input(s): DDIMER in the last 72 hours. No results for input(s): HGBA1C in the last 72 hours. No results for input(s): CHOL, HDL, LDLCALC, TRIG, CHOLHDL, LDLDIRECT in the last 72 hours. No results for input(s): TSH, T4TOTAL, T3FREE, THYROIDAB in the last 72 hours.  Invalid input(s): FREET3 No results for input(s): VITAMINB12, FOLATE, FERRITIN, TIBC, IRON, RETICCTPCT in the last 72 hours.  Recent Labs  01/01/16 1907 01/03/16 0533  LIPASE 185* 45    Urine Studies No results for input(s): UHGB, CRYS in the last 72 hours.  Invalid input(s): UACOL, UAPR, USPG, UPH, UTP, UGL, UKET, UBIL, UNIT, UROB, ULEU, UEPI, UWBC, URBC, UBAC, CAST, UCOM, BILUA  MICROBIOLOGY: No results found for this or any previous visit (from the past 240 hour(s)).  RADIOLOGY STUDIES/RESULTS: US Abdomen Complete  01/02/2016  CLINICAL DATA:  Pancreatitis EXAM: ABDOMEN ULTRASOUND COMPLETE COMPARISON:  Abdominal CT from the same day FINDINGS: Gallbladder: No gallstones or wall thickening visualized. No sonographic Murphy sign noted by sonographer. Common bile duct: Diameter: 6 mm. The duct is partially visualized due to narrow sonographic windows. Where visualized, no filling defect. Liver: Echogenic and heterogeneous with sparing noted around the gallbladder fossa. No evidence of focal lesion. Antegrade flow in the imaged portal venous system. IVC: Negative by grayscale. Pancreas: Expanded appearance of the head correlating with history of pancreatitis. Spleen: Size and appearance within normal limits. Right Kidney: Length: 14 cm. 13 mm cyst noted. Echogenicity within normal limits. No solid mass or hydronephrosis visualized. Left Kidney: Length: 13 cm. The cortex appears thin, but no thinning noted on previous abdominal CT. Echogenicity within normal limits. No mass or hydronephrosis visualized. Abdominal aorta: No aneurysm visualized. Other  findings: None. IMPRESSION: 1. No evidence of biliary calculus. 2. Hepatic steatosis. Electronically Signed   By: Marnee Spring M.D.   On: 01/02/2016 07:59   Ct Abdomen Pelvis W Contrast  01/02/2016  CLINICAL DATA:  Acute onset of generalized abdominal pain. Initial encounter. EXAM: CT ABDOMEN AND PELVIS WITH CONTRAST TECHNIQUE: Multidetector CT imaging of the abdomen and pelvis was performed using the standard protocol following bolus administration of intravenous contrast. CONTRAST:  OMNIPAQUE IOHEXOL 300 MG/ML  SOLN COMPARISON:  None. FINDINGS: Mild bibasilar atelectasis is noted. Soft tissue edema and free fluid are noted tracking about the second segment of the duodenum, and about the adjacent pancreatic head, with fluid tracking inferiorly anterior to Gerota's fascia on the right. This may reflect inflammation due to duodenitis and underlying duodenal ulceration, or possibly pancreatitis. Would correlate with the patient's symptoms. The liver and spleen are unremarkable in appearance. The gallbladder is within normal limits. The remainder of the pancreas and adrenal glands are  unremarkable. A few small renal cysts are noted bilaterally, measuring up to 1.7 cm in size. There is no evidence of hydronephrosis. A 3 mm stone is noted near the lower pole of the right kidney. No obstructing ureteral stones are seen. Mild nonspecific perinephric stranding is noted bilaterally. No free fluid is identified. The small bowel is unremarkable in appearance. The stomach is within normal limits. No acute vascular abnormalities are seen. The appendix is normal in caliber, without evidence of appendicitis. The appendix tracks about the inferior tip of the liver. The colon is largely decompressed and is unremarkable in appearance. The bladder is mildly distended and grossly unremarkable. The prostate remains normal in size, with minimal calcification. No inguinal lymphadenopathy is seen. No acute osseous abnormalities  are identified. Facet disease is noted along the lumbar spine, with a chronic defect across a left-sided articular facet at L3. IMPRESSION: 1. Soft tissue edema and free fluid tracking about the second segment of the duodenum and adjacent pancreatic head, with fluid tracking inferiorly anterior to Gerota's fascia on the right. This may reflect inflammation due to duodenitis and underlying duodenal ulceration, or possibly acute pancreatitis. Would correlate with the patient's symptoms. 2. Few small bilateral renal cysts noted. 3 mm nonobstructing stone near the lower pole of the right kidney. 3. Mild bibasilar atelectasis noted. Electronically Signed   By: Roanna Raider M.D.   On: 01/02/2016 01:07    Jeoffrey Massed, MD  [facility] Pager:[phone]  If 7PM-7AM, please contact night-coverage [email or web address] Password [email or web address] 01/03/2016, 12:28 PM   LOS: 1 day

## 2016-01-04 MED ORDER — OMEPRAZOLE 40 MG PO CPDR: 40.0000 mg | DELAYED_RELEASE_CAPSULE | Freq: Every day | ORAL | Status: AC

## 2016-01-04 MED ORDER — LOSARTAN POTASSIUM 100 MG PO TABS: 100.0000 mg | ORAL_TABLET | Freq: Every day | ORAL | Status: AC

## 2016-01-04 MED ORDER — AMLODIPINE BESYLATE 5 MG PO TABS: 5.0000 mg | ORAL_TABLET | Freq: Every day | ORAL | Status: AC

## 2016-01-04 NOTE — Discharge Summary (Signed)
PATIENT DETAILS Name: Raymond Henry Age: 50 y.o. Sex: male Date of Birth: Mar 29, 1966 MRN: [id number]. Admitting Physician: Alberteen Sam, MD NFA:[id number] Blane Ohara, MD  Admit Date: 01/01/2016 Discharge date: 01/04/2016  Recommendations for Outpatient Follow-up:  1. Ensure follow-up with [facility] gastroenterology 2. Counseled regarding avoidance of alcohol  3. Please repeat CBC/BMET at next visit  PRIMARY DISCHARGE DIAGNOSIS:  Principal Problem:   Acute pancreatitis Active Problems:   Essential hypertension   OSA (obstructive sleep apnea)      PAST MEDICAL HISTORY: Past Medical History  Diagnosis Date  . Hypertension   . Hypercholesteremia   . Pancreatitis, acute 01/02/2016  . Anxiety     DISCHARGE MEDICATIONS: Current Discharge Medication List    START taking these medications   Details  losartan (COZAAR) 100 MG tablet Take 1 tablet (100 mg total) by mouth daily. Qty: 30 tablet, Refills: 0      CONTINUE these medications which have CHANGED   Details  amLODipine (NORVASC) 5 MG tablet Take 1 tablet (5 mg total) by mouth daily. Qty: 30 tablet, Refills: 0    omeprazole (PRILOSEC) 40 MG capsule Take 1 capsule (40 mg total) by mouth daily. Qty: 30 capsule, Refills: 0      CONTINUE these medications which have NOT CHANGED   Details  aspirin EC 81 MG tablet Take 81 mg by mouth daily.    atorvastatin (LIPITOR) 80 MG tablet Take 80 mg by mouth daily.    citalopram (CELEXA) 40 MG tablet Take 40 mg by mouth daily.    fluticasone (FLONASE) 50 MCG/ACT nasal spray Place 1 spray into both nostrils daily as needed for allergies or rhinitis.      STOP taking these medications     losartan-hydrochlorothiazide (HYZAAR) 100-25 MG tablet         ALLERGIES:  No Known Allergies  BRIEF HPI:  See H&P, Labs, Consult and Test reports for all details in brief, patient was admitted for valuation of abdominal pain. Found to have pancreatitis and admitted to the hospital  for further evaluation and treatment  CONSULTATIONS:   None  PERTINENT RADIOLOGIC STUDIES: US Abdomen Complete  01/02/2016  CLINICAL DATA:  Pancreatitis EXAM: ABDOMEN ULTRASOUND COMPLETE COMPARISON:  Abdominal CT from the same day FINDINGS: Gallbladder: No gallstones or wall thickening visualized. No sonographic Murphy sign noted by sonographer. Common bile duct: Diameter: 6 mm. The duct is partially visualized due to narrow sonographic windows. Where visualized, no filling defect. Liver: Echogenic and heterogeneous with sparing noted around the gallbladder fossa. No evidence of focal lesion. Antegrade flow in the imaged portal venous system. IVC: Negative by grayscale. Pancreas: Expanded appearance of the head correlating with history of pancreatitis. Spleen: Size and appearance within normal limits. Right Kidney: Length: 14 cm. 13 mm cyst noted. Echogenicity within normal limits. No solid mass or hydronephrosis visualized. Left Kidney: Length: 13 cm. The cortex appears thin, but no thinning noted on previous abdominal CT. Echogenicity within normal limits. No mass or hydronephrosis visualized. Abdominal aorta: No aneurysm visualized. Other findings: None. IMPRESSION: 1. No evidence of biliary calculus. 2. Hepatic steatosis. Electronically Signed   By: Marnee Spring M.D.   On: 01/02/2016 07:59   Ct Abdomen Pelvis W Contrast  01/02/2016  CLINICAL DATA:  Acute onset of generalized abdominal pain. Initial encounter. EXAM: CT ABDOMEN AND PELVIS WITH CONTRAST TECHNIQUE: Multidetector CT imaging of the abdomen and pelvis was performed using the standard protocol following bolus administration of intravenous contrast. CONTRAST:  OMNIPAQUE IOHEXOL  300 MG/ML  SOLN COMPARISON:  None. FINDINGS: Mild bibasilar atelectasis is noted. Soft tissue edema and free fluid are noted tracking about the second segment of the duodenum, and about the adjacent pancreatic head, with fluid tracking inferiorly anterior to  Gerota's fascia on the right. This may reflect inflammation due to duodenitis and underlying duodenal ulceration, or possibly pancreatitis. Would correlate with the patient's symptoms. The liver and spleen are unremarkable in appearance. The gallbladder is within normal limits. The remainder of the pancreas and adrenal glands are unremarkable. A few small renal cysts are noted bilaterally, measuring up to 1.7 cm in size. There is no evidence of hydronephrosis. A 3 mm stone is noted near the lower pole of the right kidney. No obstructing ureteral stones are seen. Mild nonspecific perinephric stranding is noted bilaterally. No free fluid is identified. The small bowel is unremarkable in appearance. The stomach is within normal limits. No acute vascular abnormalities are seen. The appendix is normal in caliber, without evidence of appendicitis. The appendix tracks about the inferior tip of the liver. The colon is largely decompressed and is unremarkable in appearance. The bladder is mildly distended and grossly unremarkable. The prostate remains normal in size, with minimal calcification. No inguinal lymphadenopathy is seen. No acute osseous abnormalities are identified. Facet disease is noted along the lumbar spine, with a chronic defect across a left-sided articular facet at L3. IMPRESSION: 1. Soft tissue edema and free fluid tracking about the second segment of the duodenum and adjacent pancreatic head, with fluid tracking inferiorly anterior to Gerota's fascia on the right. This may reflect inflammation due to duodenitis and underlying duodenal ulceration, or possibly acute pancreatitis. Would correlate with the patient's symptoms. 2. Few small bilateral renal cysts noted. 3 mm nonobstructing stone near the lower pole of the right kidney. 3. Mild bibasilar atelectasis noted. Electronically Signed   By: Roanna RaiderJeffery  Chang M.D.   On: 01/02/2016 01:07     PERTINENT LAB RESULTS: CBC:  Recent Labs  01/02/16 0321  01/03/16 0533  WBC 7.3 5.4  HGB 13.9 13.1  HCT 39.7 38.3*  PLT 193 167   CMET CMP     Component Value Date/Time   NA 140 01/03/2016 0533   K 3.8 01/03/2016 0533   CL 105 01/03/2016 0533   CO2 27 01/03/2016 0533   GLUCOSE 106* 01/03/2016 0533   BUN 10 01/03/2016 0533   CREATININE 0.86 01/03/2016 0533   CALCIUM 8.7* 01/03/2016 0533   PROT 5.7* 01/03/2016 0533   ALBUMIN 3.5 01/03/2016 0533   AST 22 01/03/2016 0533   ALT 30 01/03/2016 0533   ALKPHOS 46 01/03/2016 0533   BILITOT 1.4* 01/03/2016 0533   GFRNONAA >60 01/03/2016 0533   GFRAA >60 01/03/2016 0533    GFR Estimated Creatinine Clearance: 126.2 mL/min (by C-G formula based on Cr of 0.86).  Recent Labs  01/01/16 1907 01/03/16 0533  LIPASE 185* 45   No results for input(s): CKTOTAL, CKMB, CKMBINDEX, TROPONINI in the last 72 hours. Invalid input(s): POCBNP No results for input(s): DDIMER in the last 72 hours. No results for input(s): HGBA1C in the last 72 hours. No results for input(s): CHOL, HDL, LDLCALC, TRIG, CHOLHDL, LDLDIRECT in the last 72 hours. No results for input(s): TSH, T4TOTAL, T3FREE, THYROIDAB in the last 72 hours.  Invalid input(s): FREET3 No results for input(s): VITAMINB12, FOLATE, FERRITIN, TIBC, IRON, RETICCTPCT in the last 72 hours. Coags: No results for input(s): INR in the last 72 hours.  Invalid input(s): PT Microbiology:  No results found for this or any previous visit (from the past 240 hour(s)).   BRIEF HOSPITAL COURSE:  Acute pancreatitis: Suspect secondary to alcohol-patient does acknowledge drinking 2-5 (16 ounce) bottles of beer almost on a daily basis. No gallstones evident on CT scan or abdominal ultrasound. Initially kept nothing by mouth, since improved, diet was totally advanced-by day of discharge tolerating a low-fat diet. Abdomen very soft and without any tenderness on the day of discharge. Stable for discharge-has been repeatedly counseled to completely abstain from  alcohol use in the future. Have asked patient to follow with gastroenterology as an outpatient   Active Problems: Alcohol abuse: No signs of withdrawal, per patient he can go days without having any tremors. No evidence of any alcohol withdrawal during this hospital  GERD: Continue PPI on discharge  Dyslipidemia: Continue statin  Hypertension: Controlled, continue amlodipine  OSA (obstructive sleep apnea): Continue CPAP  History of anxiety/depression: Continue Celexa   TODAY-DAY OF DISCHARGE:  Subjective:   Kamarion Zagami today has no headache,no chest abdominal pain,no new weakness tingling or numbness, feels much better wants to go home today.   Objective:   Blood pressure 134/68, pulse 78, temperature 98.9 F (37.2 C), temperature source Oral, resp. rate 20, height 5\' 9"  (1.753 m), weight 113.399 kg (250 lb), SpO2 96 %. No intake or output data in the 24 hours ending 01/04/16 0958 Filed Weights   01/01/16 1858  Weight: 113.399 kg (250 lb)    Exam Awake Alert, Oriented *3, No new F.N deficits, Normal affect [place].AT,PERRAL Supple Neck,No JVD, No cervical lymphadenopathy appriciated.  Symmetrical Chest wall movement, Good air movement bilaterally, CTAB RRR,No Gallops,Rubs or new Murmurs, No Parasternal Heave +ve B.Sounds, Abd Soft, Non tender, No organomegaly appriciated, No rebound -guarding or rigidity. No Cyanosis, Clubbing or edema, No new Rash or bruise  DISCHARGE CONDITION: Stable  DISPOSITION: Home  DISCHARGE INSTRUCTIONS:    Activity:  As tolerated  Get Medicines reviewed and adjusted: Please take all your medications with you for your next visit with your Primary MD  Please request your Primary MD to go over all hospital tests and procedure/radiological results at the follow up, please ask your Primary MD to get all Hospital records sent to his/her office.  If you experience worsening of your admission symptoms, develop shortness of breath, life  threatening emergency, suicidal or homicidal thoughts you must seek medical attention immediately by calling 911 or calling your MD immediately  if symptoms less severe.  You must read complete instructions/literature along with all the possible adverse reactions/side effects for all the Medicines you take and that have been prescribed to you. Take any new Medicines after you have completely understood and accpet all the possible adverse reactions/side effects.   Do not drive when taking Pain medications.   Do not take more than prescribed Pain, Sleep and Anxiety Medications  Special Instructions: If you have smoked or chewed Tobacco  in the last 2 yrs please stop smoking, stop any regular Alcohol  and or any Recreational drug use.  Wear Seat belts while driving.  Please note  You were cared for by a hospitalist during your hospital stay. Once you are discharged, your primary care physician will handle any further medical issues. Please note that NO REFILLS for any discharge medications will be authorized once you are discharged, as it is imperative that you return to your primary care physician (or establish a relationship with a primary care physician if you do not have one)  for your aftercare needs so that they can reassess your need for medications and monitor your lab values.   Diet recommendation: Heart Healthy diet  Discharge Instructions    Call MD for:  persistant nausea and vomiting    Complete by:  As directed      Call MD for:  severe uncontrolled pain    Complete by:  As directed      Diet - low sodium heart healthy    Complete by:  As directed   Soft-low fat diet.     Discharge instructions    Complete by:  As directed   Stop alcohol intake     Increase activity slowly    Complete by:  As directed            Follow-up Information    Follow up with Clayborn Heron, MD. Schedule an appointment as soon as possible for a visit in 1 week.   Specialty:  Family  Medicine   Why:  Hospital follow up   Contact information:   [address] [place] [address] [phone]       Follow up with [facility]. Schedule an appointment as soon as possible for a visit in 2 weeks.   Why:  Hospital follow up-with a MD of your choice   Contact information:   [address] [place] [address] [phone]       Total Time spent on discharge equals  45 minutes.  SignedJeoffrey Massed 01/04/2016 9:58 AM

## 2016-01-04 NOTE — Progress Notes (Signed)
Pt discharge education and instructions completed with pt and spouse at bedside; both voices understanding and denies any questions. Pt IV removed; pt denies any abd pain. Pt discharge home with spouse to transport him home. Pt handed his prescriptions for Prilosec, Losartan, and Norvasc. Pt transported off unit via wheelchair with belongings and spouse at side. Dionne BucyP. Amo Tamasha Laplante RN

## 2019-06-21 ENCOUNTER — Emergency Department (HOSPITAL_BASED_OUTPATIENT_CLINIC_OR_DEPARTMENT_OTHER): Payer: BC Managed Care – PPO

## 2019-06-21 ENCOUNTER — Emergency Department (HOSPITAL_BASED_OUTPATIENT_CLINIC_OR_DEPARTMENT_OTHER)
Admission: EM | Admit: 2019-06-21 | Discharge: 2019-06-21 | Disposition: A | Discharge status: Home / Self Care | Payer: BC Managed Care – PPO | Attending: Emergency Medicine | Admitting: Emergency Medicine

## 2019-06-21 ENCOUNTER — Encounter (HOSPITAL_BASED_OUTPATIENT_CLINIC_OR_DEPARTMENT_OTHER): Payer: Self-pay | Admitting: Emergency Medicine

## 2019-06-21 ENCOUNTER — Other Ambulatory Visit: Payer: Self-pay

## 2019-06-21 DIAGNOSIS — Z79899 Other long term (current) drug therapy: Secondary | ICD-10-CM | POA: Insufficient documentation

## 2019-06-21 DIAGNOSIS — N201 Calculus of ureter: Secondary | ICD-10-CM | POA: Insufficient documentation

## 2019-06-21 DIAGNOSIS — N200 Calculus of kidney: Secondary | ICD-10-CM

## 2019-06-21 DIAGNOSIS — I10 Essential (primary) hypertension: Secondary | ICD-10-CM | POA: Insufficient documentation

## 2019-06-21 DIAGNOSIS — R1031 Right lower quadrant pain: Secondary | ICD-10-CM | POA: Diagnosis present

## 2019-06-21 LAB — URINALYSIS, ROUTINE W REFLEX MICROSCOPIC
Bilirubin Urine: NEGATIVE
Glucose, UA: NEGATIVE mg/dL
Hgb urine dipstick: NEGATIVE
Ketones, ur: NEGATIVE mg/dL
Leukocytes,Ua: NEGATIVE
Nitrite: NEGATIVE
Protein, ur: NEGATIVE mg/dL
Specific Gravity, Urine: 1.02 (ref 1.005–1.030)
pH: 6 (ref 5.0–8.0)

## 2019-06-21 LAB — BASIC METABOLIC PANEL
Anion gap: 13 (ref 5–15)
BUN: 18 mg/dL (ref 6–20)
CO2: 23 mmol/L (ref 22–32)
Calcium: 9.4 mg/dL (ref 8.9–10.3)
Chloride: 106 mmol/L (ref 98–111)
Creatinine, Ser: 1.1 mg/dL (ref 0.61–1.24)
GFR calc Af Amer: >60 mL/min (ref >60)
GFR calc non Af Amer: >60 mL/min (ref >60)
Glucose, Bld: 136 mg/dL — ABNORMAL HIGH (ref 70–99)
Potassium: 3.7 mmol/L (ref 3.5–5.1)
Sodium: 142 mmol/L (ref 135–145)

## 2019-06-21 LAB — CBC
HCT: 39.6 % (ref 39.0–52.0)
Hemoglobin: 13.7 g/dL (ref 13.0–17.0)
MCH: 30.1 pg (ref 26.0–34.0)
MCHC: 34.6 g/dL (ref 30.0–36.0)
MCV: 87 fL (ref 80.0–100.0)
Platelets: 231 10*3/uL (ref 150–400)
RBC: 4.55 MIL/uL (ref 4.22–5.81)
RDW: 12.5 % (ref 11.5–15.5)
WBC: 5.6 10*3/uL (ref 4.0–10.5)
nRBC: 0 % (ref 0.0–0.2)

## 2019-06-21 MED ORDER — ONDANSETRON HCL 4 MG/2ML IJ SOLN
4.0000 mg | Freq: Once | INTRAMUSCULAR | Status: AC
  Administered 2019-06-21: 08:00:00 4 mg via INTRAVENOUS

## 2019-06-21 MED ORDER — FENTANYL CITRATE (PF) 100 MCG/2ML IJ SOLN
INTRAMUSCULAR | Status: AC
  Administered 2019-06-21: 50 ug via INTRAVENOUS
  Filled 2019-06-21: qty 2

## 2019-06-21 MED ORDER — FENTANYL CITRATE (PF) 100 MCG/2ML IJ SOLN
50.0000 ug | INTRAMUSCULAR | Status: AC | PRN
  Administered 2019-06-21 (×2): 50 ug via INTRAVENOUS
  Filled 2019-06-21: qty 2

## 2019-06-21 MED ORDER — KETOROLAC TROMETHAMINE 15 MG/ML IJ SOLN
15.0000 mg | Freq: Once | INTRAMUSCULAR | Status: AC
  Administered 2019-06-21: 08:00:00 15 mg via INTRAVENOUS
  Filled 2019-06-21: qty 1

## 2019-06-21 MED ORDER — SODIUM CHLORIDE 0.9 % IV BOLUS
1000.0000 mL | Freq: Once | INTRAVENOUS | Status: AC
  Administered 2019-06-21: 1000 mL via INTRAVENOUS

## 2019-06-21 MED ORDER — ONDANSETRON HCL 4 MG/2ML IJ SOLN
INTRAMUSCULAR | Status: AC
  Administered 2019-06-21: 4 mg via INTRAVENOUS
  Filled 2019-06-21: qty 2

## 2019-06-21 MED ORDER — ONDANSETRON HCL 4 MG PO TABS: 4.0000 mg | ORAL_TABLET | Freq: Four times a day (QID) | ORAL | 0 refills | Status: AC

## 2019-06-21 MED ORDER — MORPHINE SULFATE (PF) 4 MG/ML IV SOLN
6.0000 mg | Freq: Once | INTRAVENOUS | Status: AC
Start: 2019-06-21 — End: 2019-06-21
  Administered 2019-06-21: 08:00:00 6 mg via INTRAVENOUS
  Filled 2019-06-21: qty 2

## 2019-06-21 MED ORDER — OXYCODONE-ACETAMINOPHEN 5-325 MG PO TABS: 1.0000 | ORAL_TABLET | Freq: Four times a day (QID) | ORAL | 0 refills | Status: AC | PRN

## 2019-06-21 MED FILL — ONDANSETRON HCL 4 MG TABLET: 4 | 3 days supply | Qty: 12 | Fill #0

## 2019-06-21 MED FILL — OXYCODONE-ACETAMINOPHEN 5-3: 5-325 | 2 days supply | Qty: 15 | Fill #0

## 2019-06-21 NOTE — ED Triage Notes (Signed)
Right flank pain since this am.  Pt is diaphoretic and restless.  No history of kidney stones known.  No fever. Some nausea.

## 2019-06-21 NOTE — Discharge Instructions (Signed)
Take 600 mg of ibuprofen every 6 hours as needed for pain in additional to prescribed pain medication. They are safe to take together and you'll get better pain control taking them both.

## 2019-06-27 NOTE — ED Provider Notes (Signed)
[facility] EMERGENCY DEPARTMENT Provider Note   CSN: [id number] Arrival date & time: 06/21/19  0706     History   Chief Complaint Chief Complaint  Patient presents with   Flank Pain    HPI Raymond Henry is a 53 y.o. male.     HPI   53 year old male with right flank pain.  Acute onset this morning.  Went to sleep in his usual state of health.  Severe sharp pain in his right flank/back.  Associated nausea.  No vomiting.  Diaphoretic.  No history of similar symptoms.  No fevers.  No urinary complaints.  No prior history of biliary colic or renal stones.  Past Medical History:  Diagnosis Date   Anxiety    Hypercholesteremia    Hypertension    Pancreatitis, acute 01/02/2016    Patient Active Problem List   Diagnosis Date Noted   Acute pancreatitis 01/02/2016   Essential hypertension 01/02/2016   OSA (obstructive sleep apnea) 01/02/2016    Past Surgical History:  Procedure Laterality Date   VASECTOMY     WRIST SURGERY          Home Medications    Prior to Admission medications   Medication Sig Start Date End Date Taking? Authorizing Provider  amLODipine (NORVASC) 5 MG tablet Take 1 tablet (5 mg total) by mouth daily. 01/04/16   Ghimire, Werner LeanShanker M, MD  aspirin EC 81 MG tablet Take 81 mg by mouth daily.    [provider]  atorvastatin (LIPITOR) 80 MG tablet Take 80 mg by mouth daily.    [provider]  citalopram (CELEXA) 40 MG tablet Take 40 mg by mouth daily.    [provider]  fluticasone (FLONASE) 50 MCG/ACT nasal spray Place 1 spray into both nostrils daily as needed for allergies or rhinitis.    [provider]  losartan (COZAAR) 100 MG tablet Take 1 tablet (100 mg total) by mouth daily. 01/04/16   Ghimire, Werner LeanShanker M, MD  omeprazole (PRILOSEC) 40 MG capsule Take 1 capsule (40 mg total) by mouth daily. 01/04/16   Ghimire, Werner LeanShanker M, MD  ondansetron (ZOFRAN) 4 MG tablet Take 1 tablet (4 mg total) by mouth  every 6 (six) hours. 06/21/19   Raeford RazorKohut, Apryle Stowell, MD  oxyCODONE-acetaminophen (PERCOCET/ROXICET) 5-325 MG tablet Take 1-2 tablets by mouth every 6 (six) hours as needed for severe pain. 06/21/19   Raeford RazorKohut, Janyiah Silveri, MD    Family History Family History  Problem Relation Age of Onset   Stroke Mother        Premature strokes, died age 55   Diabetes Father        Legs amputated    Social History Social History   Tobacco Use   Smoking status: Never Smoker   Smokeless tobacco: Never Used  Substance Use Topics   Alcohol use: Yes    Comment: beer and vodka daily   Drug use: No     Allergies   Patient has no known allergies.   Review of Systems Review of Systems  All systems reviewed and negative, other than as noted in HPI.  Physical Exam Updated Vital Signs BP 120/68    Pulse 70    Temp 98.5 F (36.9 C) (Oral)    Resp 16    Ht 5\' 9"  (1.753 m)    Wt 111.1 kg    SpO2 98%    BMI 36.18 kg/m   Physical Exam Vitals signs and nursing note reviewed.  Constitutional:  Appearance: He is well-developed.     Comments: Beside stretcher bending over it.  Appears very uncomfortable.  Diaphoretic.  HENT:     Head: Normocephalic and atraumatic.  Eyes:     General:        Right eye: No discharge.        Left eye: No discharge.     Conjunctiva/sclera: Conjunctivae normal.  Neck:     Musculoskeletal: Neck supple.  Cardiovascular:     Rate and Rhythm: Normal rate and regular rhythm.     Heart sounds: Normal heart sounds. No murmur. No friction rub. No gallop.   Pulmonary:     Effort: Pulmonary effort is normal. No respiratory distress.     Breath sounds: Normal breath sounds.  Abdominal:     General: There is no distension.     Palpations: Abdomen is soft.     Tenderness: There is no abdominal tenderness.  Musculoskeletal:        General: No tenderness.  Skin:    General: Skin is warm and dry.  Neurological:     Mental Status: He is alert.  Psychiatric:        Behavior:  Behavior normal.        Thought Content: Thought content normal.      ED Treatments / Results  Labs (all labs ordered are listed, but only abnormal results are displayed) Labs Reviewed  BASIC METABOLIC PANEL - Abnormal; Notable for the following components:      Result Value   Glucose, Bld 136 (*)    All other components within normal limits  URINALYSIS, ROUTINE W REFLEX MICROSCOPIC  CBC    EKG    Radiology No results found.   Ct Renal Stone Study  Result Date: 06/21/2019 CLINICAL DATA:  Flank pain EXAM: CT ABDOMEN AND PELVIS WITHOUT CONTRAST TECHNIQUE: Multidetector CT imaging of the abdomen and pelvis was performed following the standard protocol without oral or IV contrast. COMPARISON:  January 02, 2016 FINDINGS: Lower chest: Lung bases are clear.  There is a small hiatal hernia. Hepatobiliary: No focal liver lesions are appreciable on this noncontrast enhanced study. Gallbladder wall is not appreciably thickened. There is no biliary duct dilatation. Pancreas: There is no pancreatic mass or fluid collection. Pancreatic duct appears unremarkable. Spleen: No splenic lesions are evident. There is an accessory spleen medial to the spleen anteriorly. Adrenals/Urinary Tract: Adrenals appear normal bilaterally. There is mild hydronephrosis on each side, slightly more severe on the right than on the left. Both kidneys appear mildly edematous, more notable on the right than on the left. There is no evident renal mass on either side. There is no intrarenal calculus on either side. On the right, there is a 3 x 3 mm calculus in the right ureter at the L4-5 level. No other ureteral calculus is evident on the right. On the left, there is no demonstrable ureteral calculus. Urinary bladder is midline with wall thickness within normal limits. Stomach/Bowel: There is no appreciable bowel wall or mesenteric thickening. There is no evident bowel obstruction. There is no appreciable free air or portal venous  air. Vascular/Lymphatic: There is aortic atherosclerosis. No aneurysm evident. No adenopathy is appreciable in the abdomen or pelvis. Reproductive: Prostate and seminal vesicles are normal in size and contour. No pelvic masses are evident. Other: There is no periappendiceal region inflammatory change. There is no abscess or ascites in the abdomen or pelvis. There is fat in each inguinal ring. In the mid abdomen at and  slightly to the left of midline, there is nonspecific soft tissue stranding which is not distorting vasculature or bowel. This area of soft tissue stranding in the mesentery measures approximately 7.5 x 7.5 cm. Musculoskeletal: There is degenerative change in the lower thoracic and lumbar regions. There is evidence of an old healed rib fracture on the left posteriorly. No intramuscular lesions are evident. IMPRESSION: 1. There is mild hydronephrosis on the right. There is a 3 mm calculus in the right ureter at the L4-5 level. 2. Mild hydronephrosis on the left without appreciable obstructing focus. Question recent calculus passage on the left. Early pyelonephritis on the left is a consideration. No evidence of renal abscess on either side. Note that the urinary bladder appears unremarkable. 3. Mesenteric thickening in the midportion of the abdomen slightly to the left of midline which has an appearance suggestive of sclerosing mesenteritis. Residua of prior pancreatitis potentially could present in this manner. Note that the pancreas otherwise appears normal. There is no peripancreatic fluid. 4.  No bowel obstruction.  No abscess in the abdomen or pelvis. 5.  Small hiatal hernia.  Fat noted in each inguinal ring. 6.  Aortic Atherosclerosis (ICD10-I70.0). Electronically Signed   By: Lowella Grip III M.D.   On: 06/21/2019 08:12    Procedures Procedures (including critical care time)  Medications Ordered in ED Medications  fentaNYL (SUBLIMAZE) injection 50 mcg (50 mcg Intravenous Given  06/21/19 0832)  ondansetron (ZOFRAN) injection 4 mg (4 mg Intravenous Given 06/21/19 0745)  ketorolac (TORADOL) 15 MG/ML injection 15 mg (15 mg Intravenous Given 06/21/19 0750)  morphine 4 MG/ML injection 6 mg (6 mg Intravenous Given 06/21/19 0750)  sodium chloride 0.9 % bolus 1,000 mL (0 mLs Intravenous Stopped 06/21/19 0930)     Initial Impression / Assessment and Plan / ED Course  I have reviewed the triage vital signs and the nursing notes.  Pertinent labs & imaging results that were available during my care of the patient were reviewed by me and considered in my medical decision making (see chart for details).  53 year old male with right flank pain.  Symptoms and work-up consistent with ureteral stone.  Symptoms now well controlled.  Plan expectant management.  Return precautions were discussed.  Final Clinical Impressions(s) / ED Diagnoses   Final diagnoses:  Kidney stone  Ureterolithiasis    ED Discharge Orders         Ordered    oxyCODONE-acetaminophen (PERCOCET/ROXICET) 5-325 MG tablet  Every 6 hours PRN     06/21/19 0937    ondansetron (ZOFRAN) 4 MG tablet  Every 6 hours     06/21/19 0937           Virgel Manifold, MD 06/27/19 1132

## 2021-03-20 ENCOUNTER — Other Ambulatory Visit (HOSPITAL_BASED_OUTPATIENT_CLINIC_OR_DEPARTMENT_OTHER): Payer: Self-pay | Admitting: Family Medicine

## 2021-03-20 ENCOUNTER — Other Ambulatory Visit: Payer: Self-pay | Admitting: Family Medicine

## 2021-03-20 DIAGNOSIS — E7800 Pure hypercholesterolemia, unspecified: Secondary | ICD-10-CM

## 2021-06-21 IMAGING — CT CT RENAL STONE PROTOCOL
2 of 4 series · 15 of 46 positions shown, 17 images · non-contrast
Comparison: January 02, 2016

CLINICAL DATA: Flank pain

EXAM:
CT ABDOMEN AND PELVIS WITHOUT CONTRAST
TECHNIQUE: Multidetector CT imaging of the abdomen and pelvis was performed
following the standard protocol without oral or IV contrast.

[Series 2: axial st · axial · 0.79mm/px · z∈[-650,-145]mm · 12 of 116 slices shown, 14 images]
[im 10/116  soft-tissue]
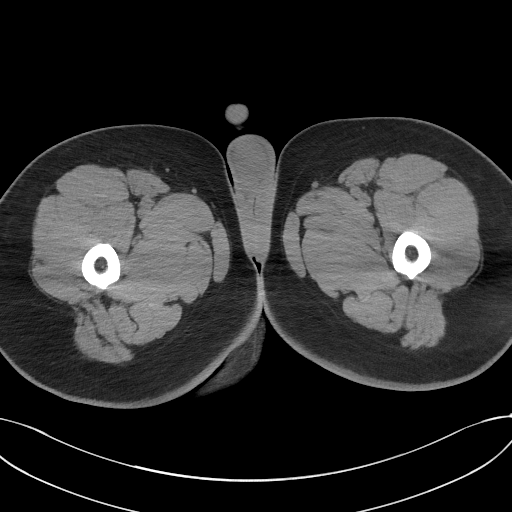
[im 10/116  bone]
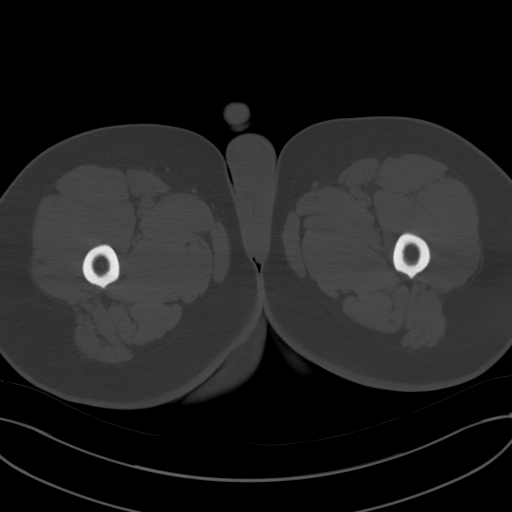
[im 19/116  soft-tissue]
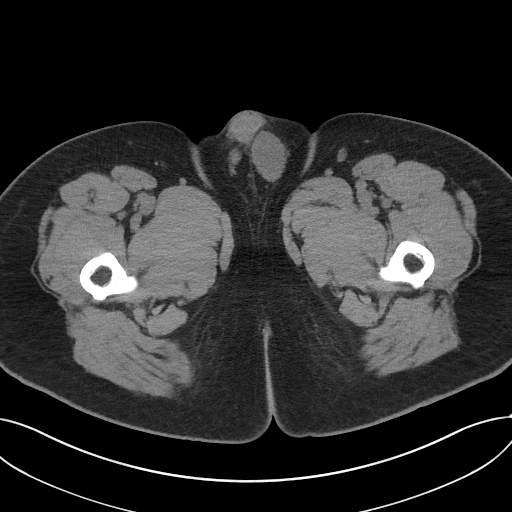
[im 28/116  soft-tissue]
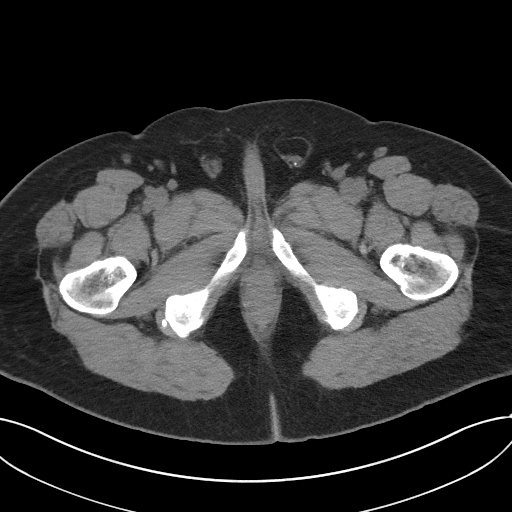
[im 37/116  soft-tissue]
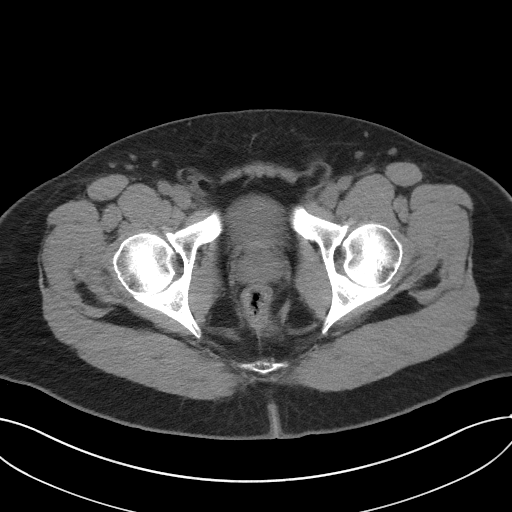
[im 47/116  soft-tissue]
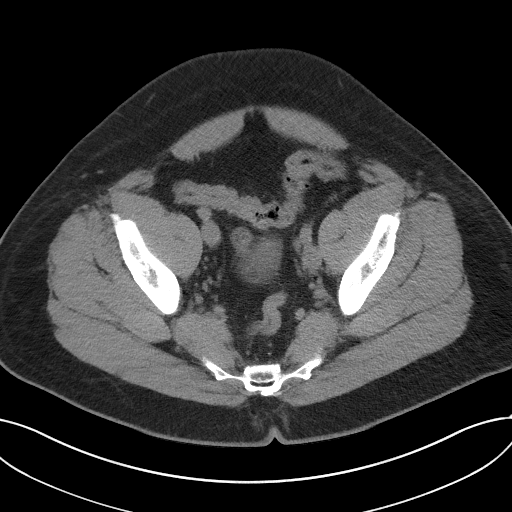
[im 56/116  soft-tissue]
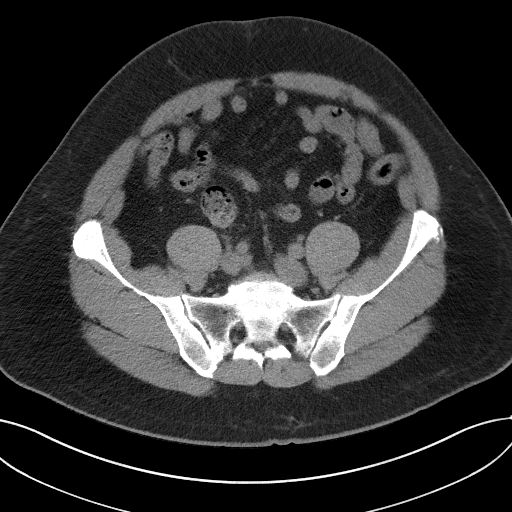
[im 65/116  soft-tissue]
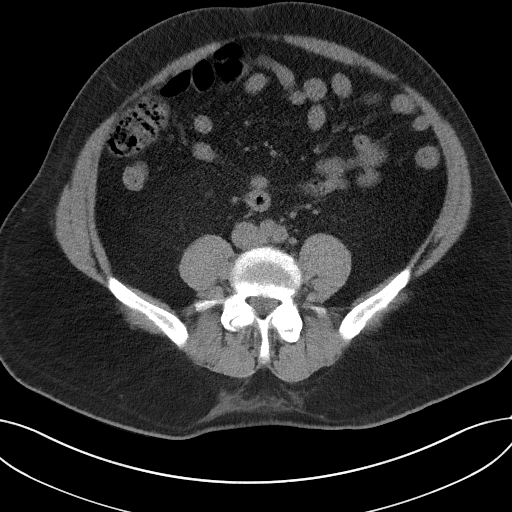
[im 74/116  soft-tissue]
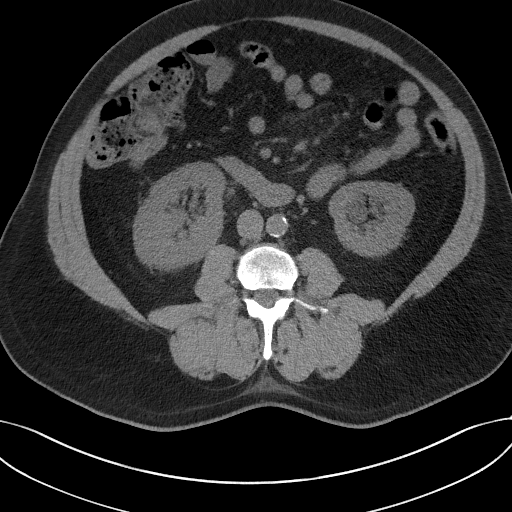
[im 83/116  soft-tissue]
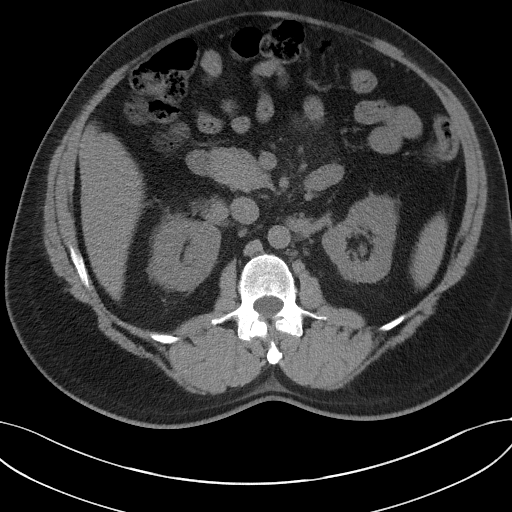
[im 83/116  bone]
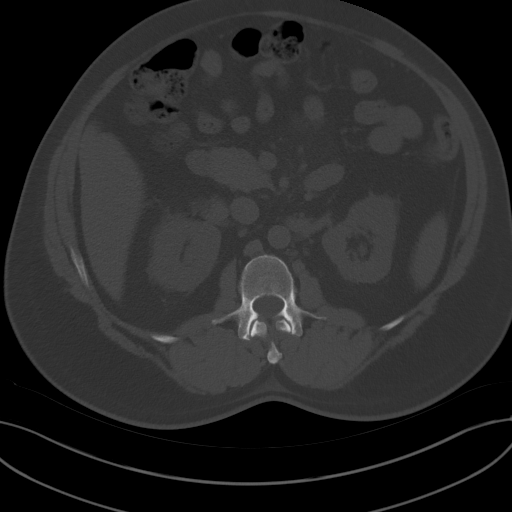
[im 93/116  soft-tissue]
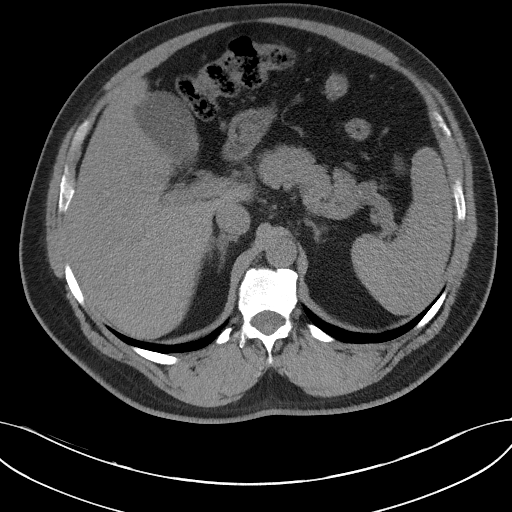
[im 102/116  soft-tissue]
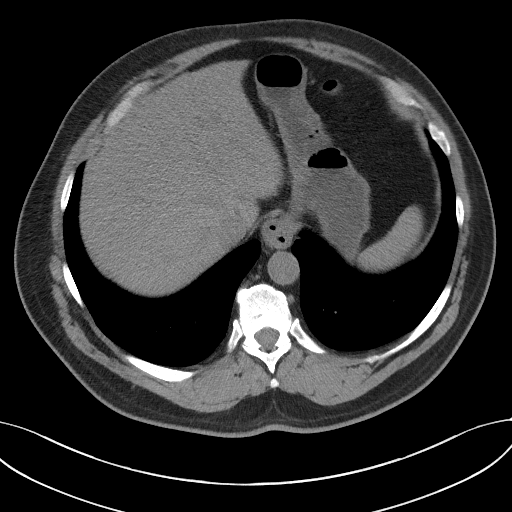
[im 111/116  soft-tissue]
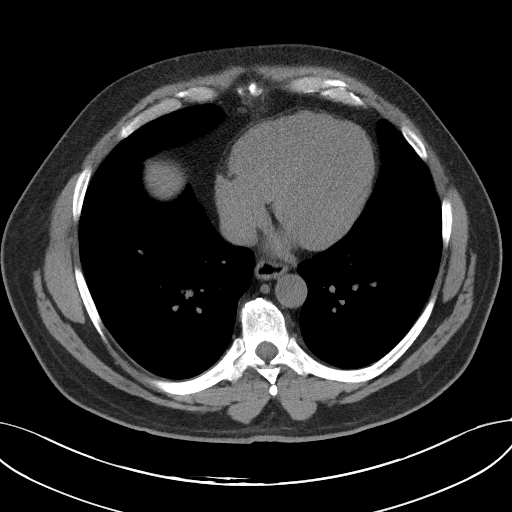

[Series 4: coronal st · coronal · 0.85mm/px · 3 of 134 slices shown]
[im 45/134  soft-tissue]
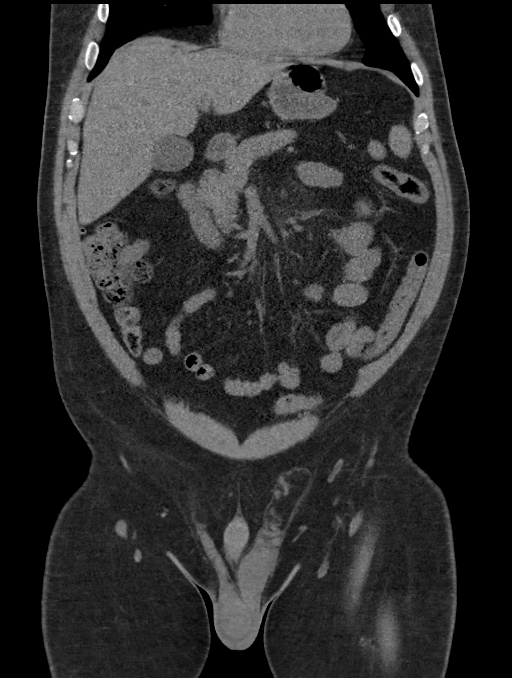
[im 60/134  soft-tissue]
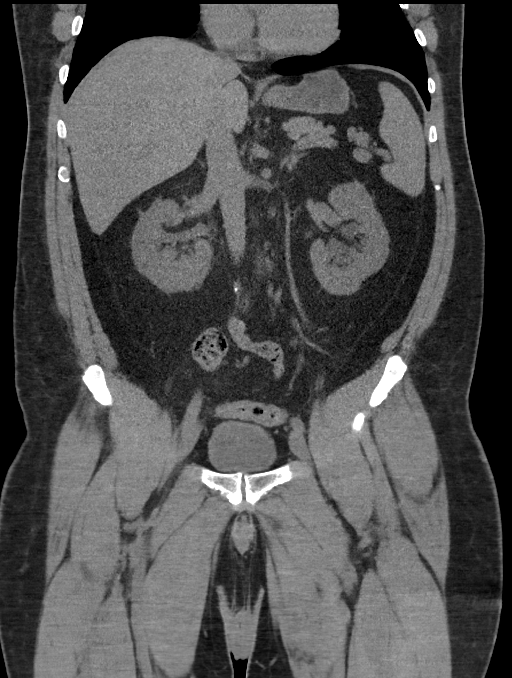
[im 74/134  soft-tissue]
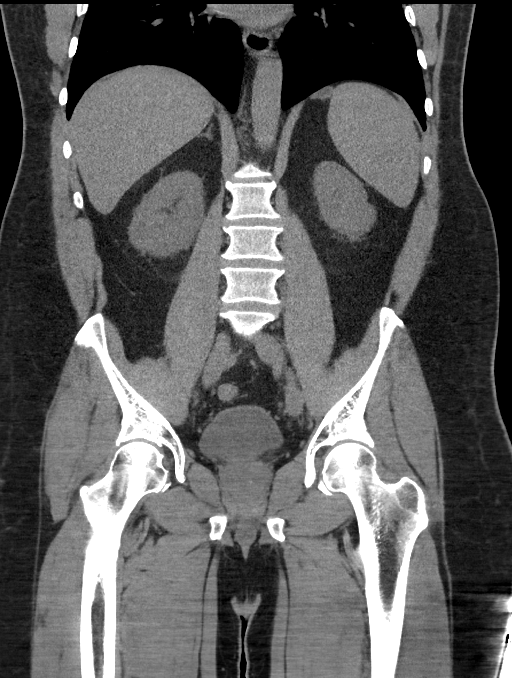

[15 of 46 positions shown; findings below may reference images not displayed]

FINDINGS: Lower chest: Lung bases are clear.  There is a small hiatal hernia.

Hepatobiliary: No focal liver lesions are appreciable on this
noncontrast enhanced study. Gallbladder wall is not appreciably
thickened. There is no biliary duct dilatation.

Pancreas: There is no pancreatic mass or fluid collection.
Pancreatic duct appears unremarkable.

Spleen: No splenic lesions are evident. There is an accessory spleen
medial to the spleen anteriorly.

Adrenals/Urinary Tract: Adrenals appear normal bilaterally. There is
mild hydronephrosis on each side, slightly more severe on the right
than on the left. Both kidneys appear mildly edematous, more notable
on the right than on the left. There is no evident renal mass on
either side. There is no intrarenal calculus on either side. On the
right, there is a 3 x 3 mm calculus in the right ureter at the L4-5
level. No other ureteral calculus is evident on the right. On the
left, there is no demonstrable ureteral calculus. Urinary bladder is
midline with wall thickness within normal limits.

Stomach/Bowel: There is no appreciable bowel wall or mesenteric
thickening. There is no evident bowel obstruction. There is no
appreciable free air or portal venous air.

Vascular/Lymphatic: There is aortic atherosclerosis. No aneurysm
evident. No adenopathy is appreciable in the abdomen or pelvis.

Reproductive: Prostate and seminal vesicles are normal in size and
contour. No pelvic masses are evident.

Other: There is no periappendiceal region inflammatory change. There
is no abscess or ascites in the abdomen or pelvis. There is fat in
each inguinal ring.

In the mid abdomen at and slightly to the left of midline, there is
nonspecific soft tissue stranding which is not distorting
vasculature or bowel. This area of soft tissue stranding in the
mesentery measures approximately 7.5 x 7.5 cm.

Musculoskeletal: There is degenerative change in the lower thoracic
and lumbar regions. There is evidence of an old healed rib fracture
on the left posteriorly. No intramuscular lesions are evident.
IMPRESSION: 1. There is mild hydronephrosis on the right. There is a 3 mm
calculus in the right ureter at the L4-5 level.

2. Mild hydronephrosis on the left without appreciable obstructing
focus. Question recent calculus passage on the left. Early
pyelonephritis on the left is a consideration. No evidence of renal
abscess on either side. Note that the urinary bladder appears
unremarkable.

3. Mesenteric thickening in the midportion of the abdomen slightly
to the left of midline which has an appearance suggestive of
sclerosing mesenteritis. Residua of prior pancreatitis potentially
could present in this manner. Note that the pancreas otherwise
appears normal. There is no peripancreatic fluid.

4.  No bowel obstruction.  No abscess in the abdomen or pelvis.

5.  Small hiatal hernia.  Fat noted in each inguinal ring.

6.  Aortic Atherosclerosis ([id number]).
